# Patient Record
Sex: Female | Born: 1980 | Race: White | Hispanic: No | Marital: Married | Smoking: Never smoker
Health system: Southern US, Community
[De-identification: ages and names within clinical notes are randomized; demographics above are authoritative.]

## PROBLEM LIST (undated history)

## (undated) ENCOUNTER — Inpatient Hospital Stay (HOSPITAL_COMMUNITY): Payer: Self-pay

## (undated) DIAGNOSIS — E039 Hypothyroidism, unspecified: Secondary | ICD-10-CM

## (undated) DIAGNOSIS — E282 Polycystic ovarian syndrome: Secondary | ICD-10-CM

## (undated) HISTORY — PX: TONSILLECTOMY: SUR1361

## (undated) HISTORY — PX: LEG SURGERY: SHX1003

## (undated) HISTORY — PX: WISDOM TOOTH EXTRACTION: SHX21

---

## 2014-07-09 ENCOUNTER — Other Ambulatory Visit (HOSPITAL_COMMUNITY): Payer: Self-pay | Admitting: Obstetrics and Gynecology

## 2014-07-09 DIAGNOSIS — Z3141 Encounter for fertility testing: Secondary | ICD-10-CM

## 2014-07-16 ENCOUNTER — Ambulatory Visit (HOSPITAL_COMMUNITY)
Admission: RE | Admit: 2014-07-16 | Discharge: 2014-07-16 | Disposition: A | Discharge status: Home / Self Care | Payer: BC Managed Care – PPO | Source: Ambulatory Visit | Attending: Obstetrics and Gynecology | Admitting: Obstetrics and Gynecology

## 2014-07-16 DIAGNOSIS — Z3141 Encounter for fertility testing: Secondary | ICD-10-CM

## 2014-07-16 DIAGNOSIS — N979 Female infertility, unspecified: Secondary | ICD-10-CM | POA: Diagnosis present

## 2014-07-16 MED ORDER — IOHEXOL 300 MG/ML  SOLN
20.0000 mL | Freq: Once | INTRAMUSCULAR | Status: AC | PRN
  Administered 2014-07-16: 20 mL

## 2014-10-27 ENCOUNTER — Inpatient Hospital Stay (HOSPITAL_COMMUNITY)
Admission: AD | Admit: 2014-10-27 | Discharge: 2014-10-27 | Disposition: A | Discharge status: Home / Self Care | Payer: BC Managed Care – PPO | Source: Ambulatory Visit | Attending: Obstetrics and Gynecology | Admitting: Obstetrics and Gynecology

## 2014-10-27 ENCOUNTER — Encounter (HOSPITAL_COMMUNITY): Payer: Self-pay | Admitting: *Deleted

## 2014-10-27 ENCOUNTER — Inpatient Hospital Stay (HOSPITAL_COMMUNITY): Payer: BC Managed Care – PPO

## 2014-10-27 DIAGNOSIS — O209 Hemorrhage in early pregnancy, unspecified: Secondary | ICD-10-CM | POA: Diagnosis not present

## 2014-10-27 DIAGNOSIS — Z3A01 Less than 8 weeks gestation of pregnancy: Secondary | ICD-10-CM | POA: Insufficient documentation

## 2014-10-27 HISTORY — DX: Hypothyroidism, unspecified: E03.9

## 2014-10-27 HISTORY — DX: Polycystic ovarian syndrome: E28.2

## 2014-10-27 LAB — CBC WITH DIFFERENTIAL/PLATELET
Basophils Absolute: 0 10*3/uL (ref 0.0–0.1)
Basophils Relative: 0 % (ref 0–1)
EOS ABS: 0.2 10*3/uL (ref 0.0–0.7)
Eosinophils Relative: 2 % (ref 0–5)
HEMATOCRIT: 39.6 % (ref 36.0–46.0)
HEMOGLOBIN: 13.4 g/dL (ref 12.0–15.0)
LYMPHS ABS: 2.7 10*3/uL (ref 0.7–4.0)
Lymphocytes Relative: 24 % (ref 12–46)
MCH: 29 pg (ref 26.0–34.0)
MCHC: 33.8 g/dL (ref 30.0–36.0)
MCV: 85.7 fL (ref 78.0–100.0)
MONO ABS: 0.8 10*3/uL (ref 0.1–1.0)
MONOS PCT: 7 % (ref 3–12)
NEUTROS PCT: 67 % (ref 43–77)
Neutro Abs: 7.4 10*3/uL (ref 1.7–7.7)
Platelets: 334 10*3/uL (ref 150–400)
RBC: 4.62 MIL/uL (ref 3.87–5.11)
RDW: 14.1 % (ref 11.5–15.5)
WBC: 11.1 10*3/uL — ABNORMAL HIGH (ref 4.0–10.5)

## 2014-10-27 LAB — URINALYSIS, ROUTINE W REFLEX MICROSCOPIC
Bilirubin Urine: NEGATIVE
Glucose, UA: NEGATIVE mg/dL
Ketones, ur: NEGATIVE mg/dL
LEUKOCYTES UA: NEGATIVE
NITRITE: NEGATIVE
Protein, ur: NEGATIVE mg/dL
Urobilinogen, UA: 0.2 mg/dL (ref 0.0–1.0)
pH: 5.5 (ref 5.0–8.0)

## 2014-10-27 LAB — POCT PREGNANCY, URINE: PREG TEST UR: POSITIVE — AB

## 2014-10-27 LAB — HCG, QUANTITATIVE, PREGNANCY: hCG, Beta Chain, Quant, S: 526 m[IU]/mL — ABNORMAL HIGH (ref <5)

## 2014-10-27 LAB — URINE MICROSCOPIC-ADD ON: WBC UA: NONE SEEN WBC/hpf (ref <3)

## 2014-10-27 LAB — WET PREP, GENITAL
CLUE CELLS WET PREP: NONE SEEN
Trich, Wet Prep: NONE SEEN
YEAST WET PREP: NONE SEEN

## 2014-10-27 LAB — ABO/RH: ABO/RH(D): A POS

## 2014-10-27 NOTE — Discharge Instructions (Signed)
Pelvic Rest °Pelvic rest is sometimes recommended for women when:  °· The placenta is partially or completely covering the opening of the cervix (placenta previa). °· There is bleeding between the uterine wall and the amniotic sac in the first trimester (subchorionic hemorrhage). °· The cervix begins to open without labor starting (incompetent cervix, cervical insufficiency). °· The labor is too early (preterm labor). °HOME CARE INSTRUCTIONS °· Do not have sexual intercourse, stimulation, or an orgasm. °· Do not use tampons, douche, or put anything in the vagina. °· Do not lift anything over 10 pounds (4.5 kg). °· Avoid strenuous activity or straining your pelvic muscles. °SEEK MEDICAL CARE IF:  °· You have any vaginal bleeding during pregnancy. Treat this as a potential emergency. °· You have cramping pain felt low in the stomach (stronger than menstrual cramps). °· You notice vaginal discharge (watery, mucus, or bloody). °· You have a low, dull backache. °· There are regular contractions or uterine tightening. °SEEK IMMEDIATE MEDICAL CARE IF: °You have vaginal bleeding and have placenta previa.  °Document Released: 08/29/2010 Document Revised: 07/27/2011 Document Reviewed: 08/29/2010 °ExitCare® Patient Information ©2015 [employer]. This information is not intended to replace advice given to you by your health care provider. Make sure you discuss any questions you have with your health care provider. ° °Threatened Miscarriage °A threatened miscarriage is when you have vaginal bleeding during your first 20 weeks of pregnancy but the pregnancy has not ended. Your doctor will do tests to make sure you are still pregnant. The cause of the bleeding may not be known. This condition does not mean your pregnancy will end. It does increase the risk of it ending (complete miscarriage). °HOME CARE  °· Make sure you keep all your doctor visits for prenatal care. °· Get plenty of rest. °· Do not have sex or use tampons if  you have vaginal bleeding. °· Do not douche. °· Do not smoke or use drugs. °· Do not drink alcohol. °· Avoid caffeine. °GET HELP IF: °· You have light bleeding from your vagina. °· You have belly pain or cramping. °· You have a fever. °GET HELP RIGHT AWAY IF:  °· You have heavy bleeding from your vagina. °· You have clots of blood coming from your vagina. °· You have bad pain or cramps in your low back or belly. °· You have fever, chills, and bad belly pain. °MAKE SURE YOU:  °· Understand these instructions. °· Will watch your condition. °· Will get help right away if you are not doing well or get worse. °Document Released: 04/16/2008 Document Revised: 05/09/2013 Document Reviewed: 02/28/2013 °ExitCare® Patient Information ©2015 [employer]. This information is not intended to replace advice given to you by your health care provider. Make sure you discuss any questions you have with your health care provider. ° °

## 2014-10-27 NOTE — MAU Note (Signed)
Pt presents to MAU with complaints of lower abdominal cramping with brown  vaginal bleeding that started yesterday and today is bright red bleeding.

## 2014-10-27 NOTE — MAU Provider Note (Signed)
History     CSN: [id number]  Arrival date and time: 10/27/14 4401   First Provider Initiated Contact with Patient 10/27/14 [phone]      Chief Complaint  Patient presents with  . Vaginal Bleeding  . Abdominal Cramping   HPI  Ms. Molly Jacobs is a 34 y.o. G1P0000 at [redacted]w[redacted]d who presents to MAU today with complaint of vaginal bleeding. The patient states brown spotting x 1 week that became red and heavier last night. She states bleeding is still less than a period, but more than spotting. She states mild lower abdominal and low back discomfort today. She has not taken anything for pain. She denies fever, UTI symptoms, N/V/D.   OB History    Gravida Para Term Preterm AB TAB SAB Ectopic Multiple Living   1 0 0 0 0 0 0 0 0 0       Past Medical History  Diagnosis Date  . PCOS (polycystic ovarian syndrome)   . Hypothyroidism     Past Surgical History  Procedure Laterality Date  . Tonsillectomy    . Leg surgery      History reviewed. No pertinent family history.  History  Substance Use Topics  . Smoking status: Never Smoker   . Smokeless tobacco: Never Used  . Alcohol Use: No    Allergies: No Known Allergies  No prescriptions prior to admission    Review of Systems  Constitutional: Negative for fever and malaise/fatigue.  Gastrointestinal: Positive for abdominal pain. Negative for nausea, vomiting, diarrhea and constipation.  Genitourinary: Negative for dysuria, urgency and frequency.       + vaginal bleeding  Musculoskeletal: Positive for back pain.   Physical Exam   Blood pressure 126/77, pulse 95, temperature 98.2 F (36.8 C), temperature source Oral, resp. rate 18, height 5' (1.524 m), weight 187 lb (84.823 kg), last menstrual period 09/09/2014.  Physical Exam  Nursing note and vitals reviewed. Constitutional: She is oriented to person, place, and time. She appears well-developed and well-nourished. No distress.  HENT:  Head: Normocephalic and atraumatic.   Cardiovascular: Normal rate.   Respiratory: Effort normal.  GI: Soft. She exhibits no distension and no mass. There is no tenderness. There is no rebound and no guarding.  Genitourinary: Uterus is not enlarged and not tender. Cervix exhibits no motion tenderness, no discharge and no friability. Right adnexum displays no mass and no tenderness. Left adnexum displays no mass and no tenderness. There is bleeding (small amount of blood in the vaginal vault) in the vagina. No vaginal discharge found.  Cervix: closed, thick  Neurological: She is alert and oriented to person, place, and time.  Skin: Skin is warm and dry. No erythema.  Psychiatric: She has a normal mood and affect.   Results for orders placed or performed during the hospital encounter of 10/27/14 (from the past 24 hour(s))  Urinalysis, Routine w reflex microscopic (not at [facility])     Status: Abnormal   Collection Time: 10/27/14  8:30 AM  Result Value Ref Range   Color, Urine YELLOW YELLOW   APPearance CLEAR CLEAR   Specific Gravity, Urine <1.005 (L) 1.005 - 1.030   pH 5.5 5.0 - 8.0   Glucose, UA NEGATIVE NEGATIVE mg/dL   Hgb urine dipstick LARGE (A) NEGATIVE   Bilirubin Urine NEGATIVE NEGATIVE   Ketones, ur NEGATIVE NEGATIVE mg/dL   Protein, ur NEGATIVE NEGATIVE mg/dL   Urobilinogen, UA 0.2 0.0 - 1.0 mg/dL   Nitrite NEGATIVE NEGATIVE   Leukocytes, UA NEGATIVE NEGATIVE  Urine microscopic-add on     Status: None   Collection Time: 10/27/14  8:30 AM  Result Value Ref Range   WBC, UA  <3 WBC/hpf    NO FORMED ELEMENTS SEEN ON URINE MICROSCOPIC EXAMINATION   RBC / HPF 3-6 <3 RBC/hpf  Pregnancy, urine POC     Status: Abnormal   Collection Time: 10/27/14  8:41 AM  Result Value Ref Range   Preg Test, Ur POSITIVE (A) NEGATIVE  Wet prep, genital     Status: Abnormal   Collection Time: 10/27/14  8:50 AM  Result Value Ref Range   Yeast Wet Prep HPF POC NONE SEEN NONE SEEN   Trich, Wet Prep NONE SEEN NONE SEEN   Clue Cells Wet Prep  HPF POC NONE SEEN NONE SEEN   WBC, Wet Prep HPF POC FEW (A) NONE SEEN  CBC with Differential/Platelet     Status: Abnormal   Collection Time: 10/27/14  8:58 AM  Result Value Ref Range   WBC 11.1 (H) 4.0 - 10.5 K/uL   RBC 4.62 3.87 - 5.11 MIL/uL   Hemoglobin 13.4 12.0 - 15.0 g/dL   HCT 16.1 09.6 - 04.5 %   MCV 85.7 78.0 - 100.0 fL   MCH 29.0 26.0 - 34.0 pg   MCHC 33.8 30.0 - 36.0 g/dL   RDW 40.9 81.1 - 91.4 %   Platelets 334 150 - 400 K/uL   Neutrophils Relative % 67 43 - 77 %   Neutro Abs 7.4 1.7 - 7.7 K/uL   Lymphocytes Relative 24 12 - 46 %   Lymphs Abs 2.7 0.7 - 4.0 K/uL   Monocytes Relative 7 3 - 12 %   Monocytes Absolute 0.8 0.1 - 1.0 K/uL   Eosinophils Relative 2 0 - 5 %   Eosinophils Absolute 0.2 0.0 - 0.7 K/uL   Basophils Relative 0 0 - 1 %   Basophils Absolute 0.0 0.0 - 0.1 K/uL  ABO/Rh     Status: None (Preliminary result)   Collection Time: 10/27/14  8:58 AM  Result Value Ref Range   ABO/RH(D) A POS   hCG, quantitative, pregnancy     Status: Abnormal   Collection Time: 10/27/14  8:58 AM  Result Value Ref Range   hCG, Beta Chain, Quant, S 526 (H) <5 mIU/mL   US Ob Comp Less 14 Wks  10/27/2014   CLINICAL DATA:  Bleeding and cramping since 10/26/2014.  EXAM: OBSTETRIC <14 WK [place] AND TRANSVAGINAL OB US  TECHNIQUE: Both transabdominal and transvaginal ultrasound examinations were performed for complete evaluation of the gestation as well as the maternal uterus, adnexal regions, and pelvic cul-de-sac. Transvaginal technique was performed to assess early pregnancy.  COMPARISON:  None.  FINDINGS: Technically difficult examination due to patient body habitus.  Intrauterine gestational sac: Round hypoechoic/cystic focus in the fundal endometrium on the left may represent a small gestational sac.  Yolk sac:  Not visualized.  Embryo:  Not visualized.  MSD: 4.1  mm   4 w   6  d  Maternal uterus/adnexae: Right ovary was unremarkable in appearance. A 2.5 cm structure was present in the  left ovary which was predominantly hyperechoic peripherally but with some central areas that were more hypoechoic in no significant internal blood flow by color Doppler. No free fluid. No adnexal mass.  IMPRESSION: 1. Possible early intrauterine gestational sac as above, but no yolk sac, fetal pole, or cardiac activity yet visualized. Recommend follow-up quantitative B-HCG levels and follow-up US in 14  days to confirm and assess viability. This recommendation follows SRU consensus guidelines: Diagnostic Criteria for Nonviable Pregnancy Early in the First Trimester. Malva Limes Med 2013; 161:0960-45. 2. No ectopic pregnancy identified. 3. 2.5 cm mixed echogenicity structure in the left ovary, possibly a corpus luteum. Attention on follow-up.   Electronically Signed   By: Sebastian Ache   On: 10/27/2014 10:03   US Ob Transvaginal  10/27/2014   CLINICAL DATA:  Bleeding and cramping since 10/26/2014.  EXAM: OBSTETRIC <14 WK [place] AND TRANSVAGINAL OB US  TECHNIQUE: Both transabdominal and transvaginal ultrasound examinations were performed for complete evaluation of the gestation as well as the maternal uterus, adnexal regions, and pelvic cul-de-sac. Transvaginal technique was performed to assess early pregnancy.  COMPARISON:  None.  FINDINGS: Technically difficult examination due to patient body habitus.  Intrauterine gestational sac: Round hypoechoic/cystic focus in the fundal endometrium on the left may represent a small gestational sac.  Yolk sac:  Not visualized.  Embryo:  Not visualized.  MSD: 4.1  mm   4 w   6  d  Maternal uterus/adnexae: Right ovary was unremarkable in appearance. A 2.5 cm structure was present in the left ovary which was predominantly hyperechoic peripherally but with some central areas that were more hypoechoic in no significant internal blood flow by color Doppler. No free fluid. No adnexal mass.  IMPRESSION: 1. Possible early intrauterine gestational sac as above, but no yolk sac, fetal pole, or  cardiac activity yet visualized. Recommend follow-up quantitative B-HCG levels and follow-up US in 14 days to confirm and assess viability. This recommendation follows SRU consensus guidelines: Diagnostic Criteria for Nonviable Pregnancy Early in the First Trimester. Malva Limes Med 2013; 409:8119-14. 2. No ectopic pregnancy identified. 3. 2.5 cm mixed echogenicity structure in the left ovary, possibly a corpus luteum. Attention on follow-up.   Electronically Signed   By: Sebastian Ache   On: 10/27/2014 10:03    MAU Course  Procedures None  MDM +UPT UA, wet prep, GC/chlamydia, CBC, ABO/Rh, quant hCG, HIV, RPR and [place] today to rule out ectopic pregnancy Discussed with Dr. Arelia Sneddon. Agrees with plan for discharge at this time. Patient should follow-up in the office on Monday for repeat labs Assessment and Plan  A: Vaginal bleeding in pregnancy Pregnancy of unknown location  P: Discharge home Bleeding/ectopic precautions discussed Patient advised to follow-up with [facility] on Monday for repeat labs  Patient may return to MAU as needed or if her condition were to change or worsen   Marny Lowenstein, PA-C  10/27/2014, 10:30 AM

## 2014-10-28 LAB — HIV ANTIBODY (ROUTINE TESTING W REFLEX): HIV Screen 4th Generation wRfx: NONREACTIVE

## 2014-10-28 LAB — RPR: RPR Ser Ql: NONREACTIVE

## 2014-10-29 LAB — GC/CHLAMYDIA PROBE AMP (~~LOC~~) NOT AT ARMC
CHLAMYDIA, DNA PROBE: NEGATIVE
NEISSERIA GONORRHEA: NEGATIVE

## 2015-05-19 NOTE — L&D Delivery Note (Signed)
Delivery Note  SVD viable female Apgars 9,10 over 2nd degree ML lac.  Placenta delivered spontaneously intact with 3VC. Repair with 2-0 Chromic with good support and hemostasis noted and R/V exam confirms.  PH art was 7.28.  [place] cord blood was sent.  Mother and baby were doing well.  EBL 300cc  Candice Campavid Carlen Rebuck, MD

## 2015-06-12 ENCOUNTER — Other Ambulatory Visit: Payer: Self-pay | Admitting: Obstetrics and Gynecology

## 2015-06-12 LAB — OB RESULTS CONSOLE ABO/RH: RH Type: POSITIVE

## 2015-06-12 LAB — OB RESULTS CONSOLE GC/CHLAMYDIA
Chlamydia: NEGATIVE
Gonorrhea: NEGATIVE

## 2015-06-12 LAB — OB RESULTS CONSOLE HIV ANTIBODY (ROUTINE TESTING): HIV: NONREACTIVE

## 2015-06-12 LAB — OB RESULTS CONSOLE RPR: RPR: NONREACTIVE

## 2015-06-12 LAB — OB RESULTS CONSOLE HEPATITIS B SURFACE ANTIGEN: HEP B S AG: NEGATIVE

## 2015-06-12 LAB — OB RESULTS CONSOLE RUBELLA ANTIBODY, IGM: RUBELLA: IMMUNE

## 2015-06-12 LAB — OB RESULTS CONSOLE ANTIBODY SCREEN: ANTIBODY SCREEN: NEGATIVE

## 2015-09-01 ENCOUNTER — Encounter (HOSPITAL_COMMUNITY): Payer: Self-pay | Admitting: *Deleted

## 2015-09-23 ENCOUNTER — Ambulatory Visit (HOSPITAL_COMMUNITY)
Admission: RE | Admit: 2015-09-23 | Discharge: 2015-09-23 | Disposition: A | Discharge status: Home / Self Care | Payer: BC Managed Care – PPO | Source: Ambulatory Visit | Attending: Obstetrics and Gynecology | Admitting: Obstetrics and Gynecology

## 2015-09-23 ENCOUNTER — Other Ambulatory Visit (HOSPITAL_COMMUNITY): Payer: Self-pay | Admitting: Obstetrics and Gynecology

## 2015-09-23 DIAGNOSIS — Z3689 Encounter for other specified antenatal screening: Secondary | ICD-10-CM

## 2015-09-23 DIAGNOSIS — Z3A23 23 weeks gestation of pregnancy: Secondary | ICD-10-CM | POA: Insufficient documentation

## 2015-09-23 DIAGNOSIS — Z36 Encounter for antenatal screening of mother: Secondary | ICD-10-CM | POA: Diagnosis not present

## 2015-09-23 DIAGNOSIS — E039 Hypothyroidism, unspecified: Secondary | ICD-10-CM | POA: Insufficient documentation

## 2015-09-23 DIAGNOSIS — O99282 Endocrine, nutritional and metabolic diseases complicating pregnancy, second trimester: Secondary | ICD-10-CM | POA: Diagnosis present

## 2015-11-06 ENCOUNTER — Encounter: Payer: BC Managed Care – PPO | Attending: Obstetrics and Gynecology | Admitting: Skilled Nursing Facility1

## 2015-11-06 VITALS — Ht 60.0 in | Wt 214.0 lb

## 2015-11-06 DIAGNOSIS — R7309 Other abnormal glucose: Secondary | ICD-10-CM | POA: Insufficient documentation

## 2015-11-06 DIAGNOSIS — O24410 Gestational diabetes mellitus in pregnancy, diet controlled: Secondary | ICD-10-CM

## 2015-11-07 ENCOUNTER — Encounter: Payer: Self-pay | Admitting: Skilled Nursing Facility1

## 2015-11-07 NOTE — Progress Notes (Signed)
  Patient was seen on 11/06/2015 for Gestational Diabetes self-management class at the [facility]. The following learning objectives were met by the patient during this course:   States the definition of Gestational Diabetes  States why dietary management is important in controlling blood glucose  Describes the effects each nutrient has on blood glucose levels  Demonstrates ability to create a balanced meal plan  Demonstrates carbohydrate counting   States when to check blood glucose levels involving a total of 4 separate occurences in a day  Demonstrates proper blood glucose monitoring techniques  States the effect of stress and exercise on blood glucose levels  States the importance of limiting caffeine and abstaining from alcohol and smoking  Demonstrates the knowledge the glucometer provided in class may not be covered by their insurance and to call their insurance provider immediately after class to know which glucometer their insurance provider does cover as well as calling their physician the next day for a prescription to the glucometer their insurance does cover (if the one provided is not) as well as the lancets and strips for that meter.  Blood glucose monitor given: one touch verio flex Lot # [id number] x Exp: 12/15/2016 Blood glucose reading: 99  Patient instructed to monitor glucose levels: FBS: 60 - <90 1 hour: <140 2 hour: <120  *Patient received handouts:  Nutrition Diabetes and Pregnancy  Carbohydrate Counting List  Patient will be seen for follow-up as needed.

## 2015-11-17 IMAGING — US US OB COMP LESS 14 WK
1 series · 13 of 28 positions shown · non-contrast
Comparison: None.

CLINICAL DATA: Bleeding and cramping since 10/26/2014.

EXAM:
OBSTETRIC <14 WK US AND TRANSVAGINAL OB US
TECHNIQUE: Both transabdominal and transvaginal ultrasound examinations were
performed for complete evaluation of the gestation as well as the
maternal uterus, adnexal regions, and pelvic cul-de-sac.
Transvaginal technique was performed to assess early pregnancy.

[Series 1: us ob comp less 14 wk · 13 of 57 slices shown]
[im 3/57]
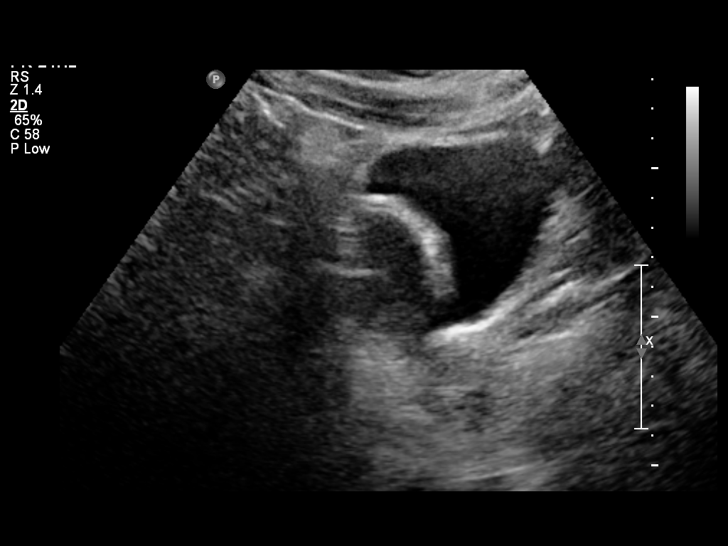
[im 7/57]
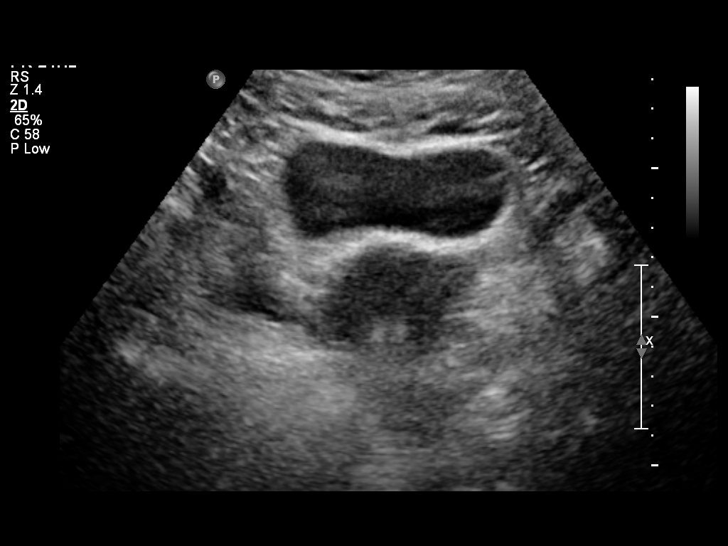
[im 11/57]
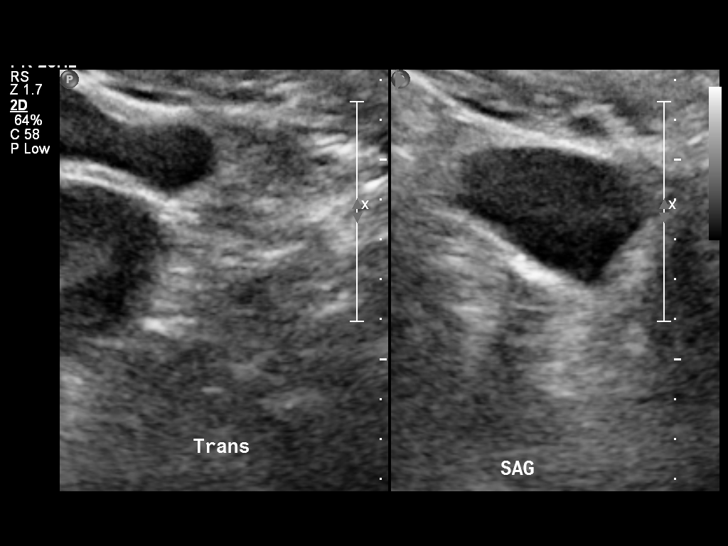
[im 15/57]
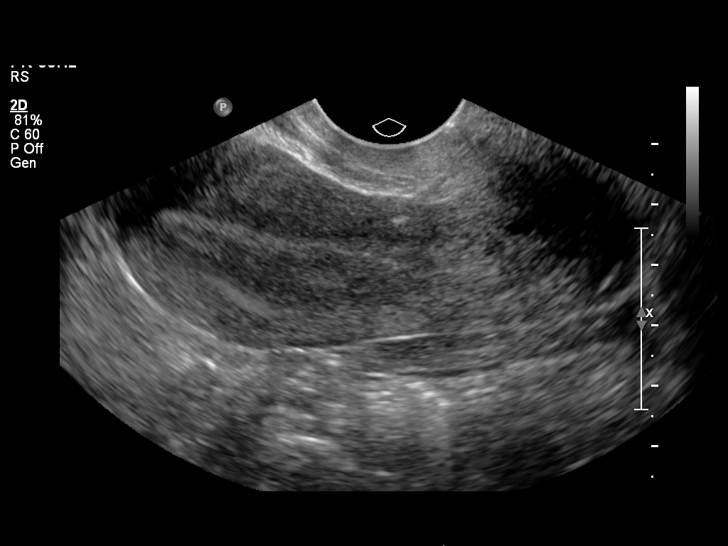
[im 19/57]
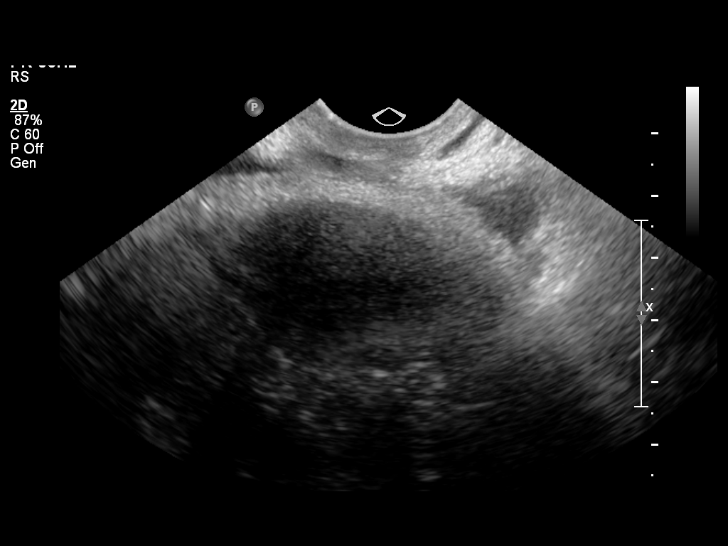
[im 23/57]
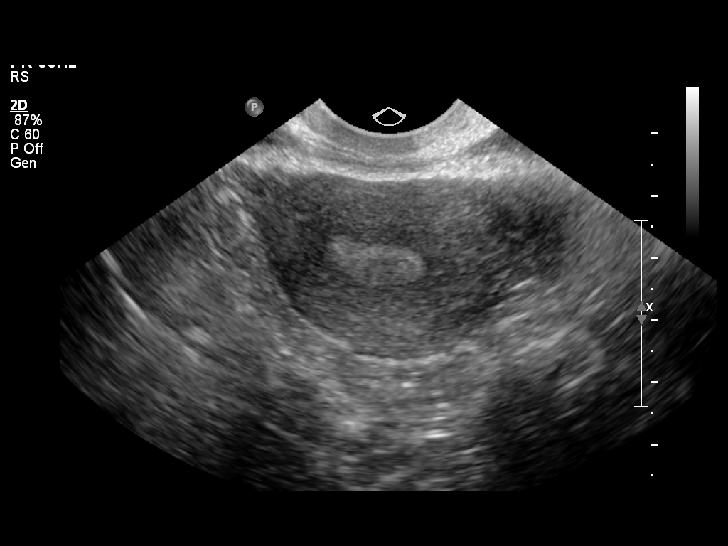
[im 30/57]
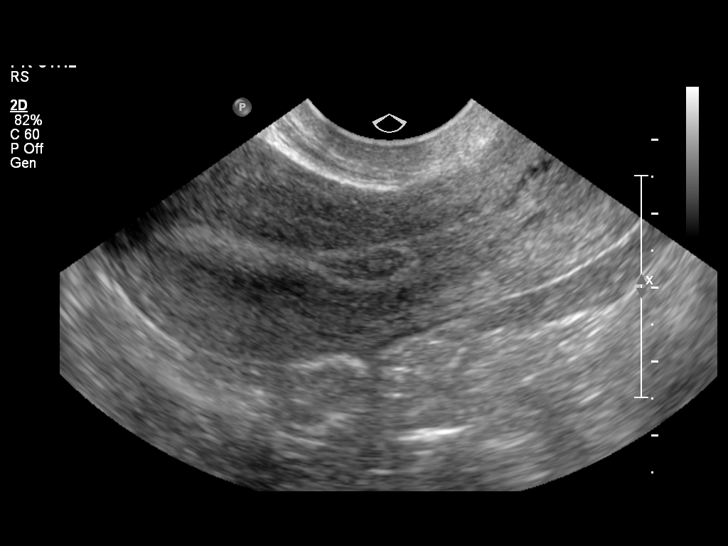
[im 34/57]
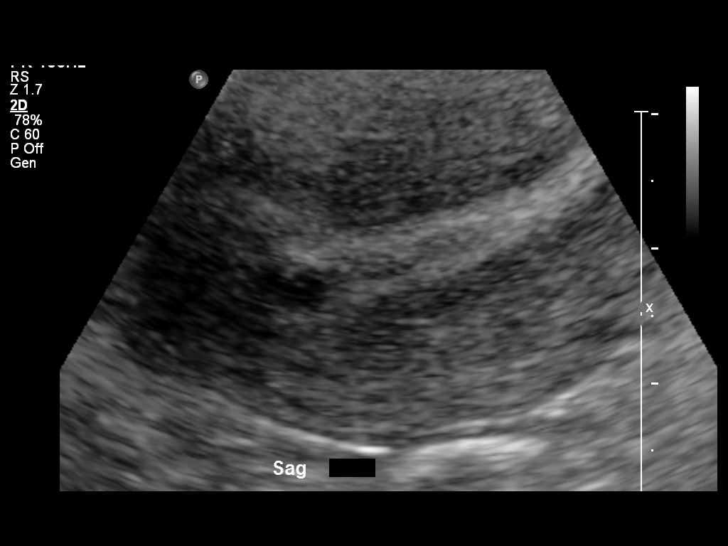
[im 38/57]
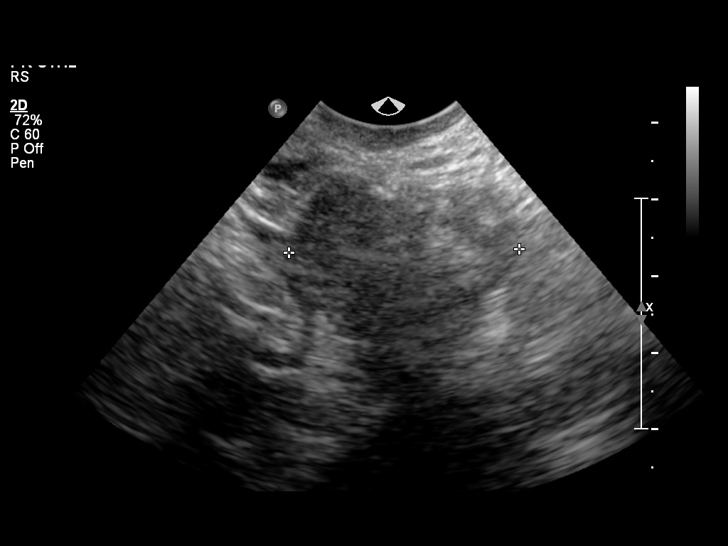
[im 42/57]
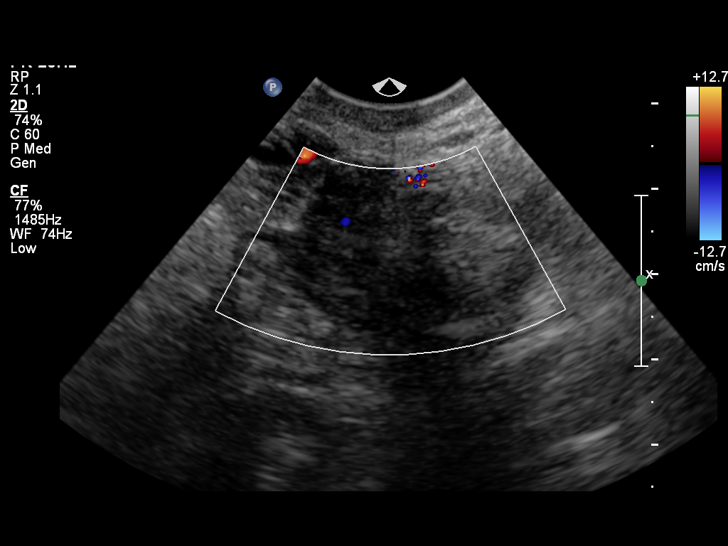
[im 46/57]
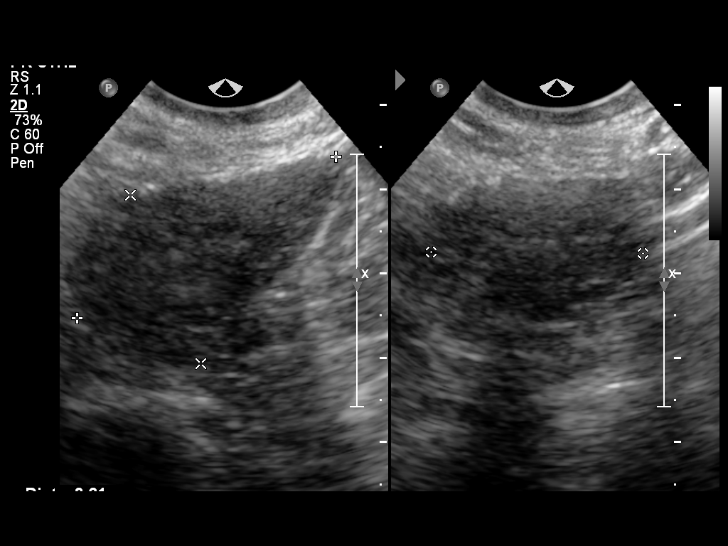
[im 50/57]
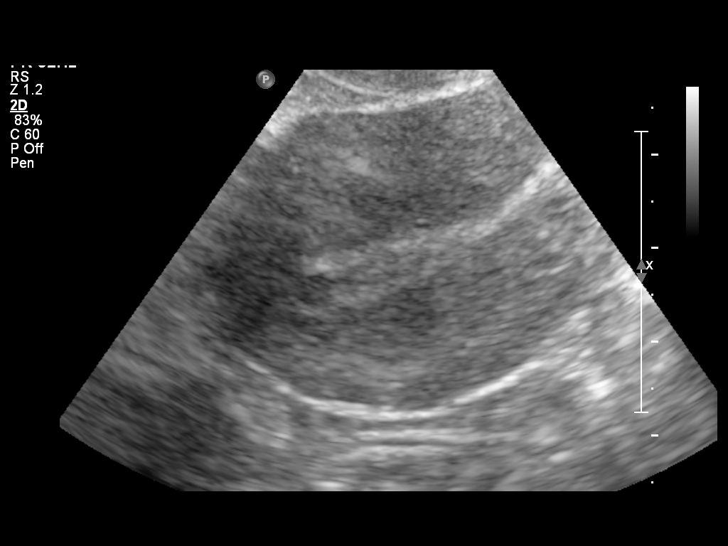
[im 54/57]
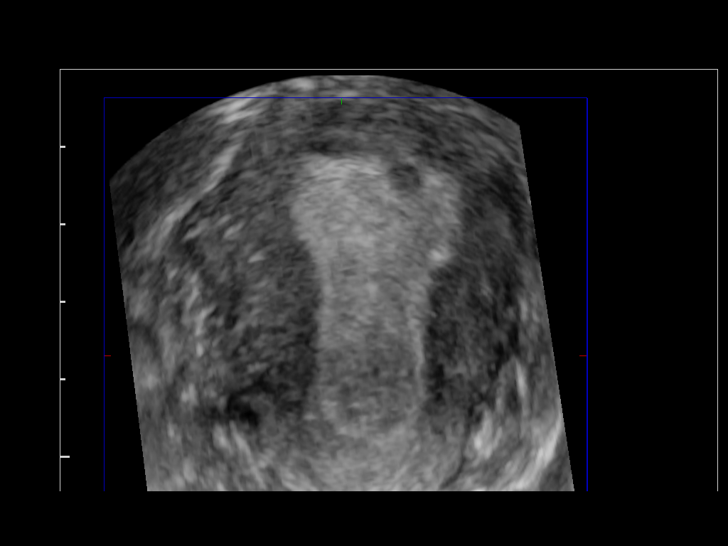

[13 of 28 positions shown; findings below may reference images not displayed]

FINDINGS: Technically difficult examination due to patient body habitus.

Intrauterine gestational sac: Round hypoechoic/cystic focus in the
fundal endometrium on the left may represent a small gestational
sac.

Yolk sac:  Not visualized.

Embryo:  Not visualized.

MSD: 4.1  mm   4 w   6  d

Maternal uterus/adnexae: Right ovary was unremarkable in appearance.
A 2.5 cm structure was present in the left ovary which was
predominantly hyperechoic peripherally but with some central areas
that were more hypoechoic in no significant internal blood flow by
color Doppler. No free fluid. No adnexal mass.
IMPRESSION: 1. Possible early intrauterine gestational sac as above, but no yolk
sac, fetal pole, or cardiac activity yet visualized. Recommend
follow-up quantitative B-HCG levels and follow-up US in 14 days to
confirm and assess viability. This recommendation follows SRU
consensus guidelines: Diagnostic Criteria for Nonviable Pregnancy
Early in the First Trimester. N Engl J Med 7524; [DATE].
2. No ectopic pregnancy identified.
3. 2.5 cm mixed echogenicity structure in the left ovary, possibly a
corpus luteum. Attention on follow-up.

## 2015-12-17 LAB — OB RESULTS CONSOLE GBS: GBS: POSITIVE

## 2016-01-14 ENCOUNTER — Telehealth (HOSPITAL_COMMUNITY): Payer: Self-pay | Admitting: *Deleted

## 2016-01-14 ENCOUNTER — Encounter (HOSPITAL_COMMUNITY): Payer: Self-pay | Admitting: *Deleted

## 2016-01-14 NOTE — Telephone Encounter (Signed)
Preadmission screen  

## 2016-01-21 ENCOUNTER — Inpatient Hospital Stay (HOSPITAL_COMMUNITY): Payer: BC Managed Care – PPO | Admitting: Anesthesiology

## 2016-01-21 ENCOUNTER — Encounter (HOSPITAL_COMMUNITY): Payer: Self-pay | Admitting: *Deleted

## 2016-01-21 ENCOUNTER — Inpatient Hospital Stay (HOSPITAL_COMMUNITY)
Admission: AD | Admit: 2016-01-21 | Discharge: 2016-01-23 | DRG: 775 | Disposition: A | Discharge status: Home / Self Care | Payer: BC Managed Care – PPO | Source: Ambulatory Visit | Attending: Obstetrics and Gynecology | Admitting: Obstetrics and Gynecology

## 2016-01-21 ENCOUNTER — Inpatient Hospital Stay (HOSPITAL_COMMUNITY): Admission: RE | Admit: 2016-01-21 | Payer: BC Managed Care – PPO | Source: Ambulatory Visit

## 2016-01-21 DIAGNOSIS — Z833 Family history of diabetes mellitus: Secondary | ICD-10-CM

## 2016-01-21 DIAGNOSIS — Z3A40 40 weeks gestation of pregnancy: Secondary | ICD-10-CM | POA: Diagnosis not present

## 2016-01-21 DIAGNOSIS — O99824 Streptococcus B carrier state complicating childbirth: Secondary | ICD-10-CM | POA: Diagnosis present

## 2016-01-21 DIAGNOSIS — O2243 Hemorrhoids in pregnancy, third trimester: Secondary | ICD-10-CM | POA: Diagnosis present

## 2016-01-21 DIAGNOSIS — O24420 Gestational diabetes mellitus in childbirth, diet controlled: Secondary | ICD-10-CM | POA: Diagnosis present

## 2016-01-21 DIAGNOSIS — Z823 Family history of stroke: Secondary | ICD-10-CM | POA: Diagnosis not present

## 2016-01-21 DIAGNOSIS — O99214 Obesity complicating childbirth: Secondary | ICD-10-CM | POA: Diagnosis present

## 2016-01-21 DIAGNOSIS — Z6841 Body Mass Index (BMI) 40.0 and over, adult: Secondary | ICD-10-CM

## 2016-01-21 DIAGNOSIS — Z8249 Family history of ischemic heart disease and other diseases of the circulatory system: Secondary | ICD-10-CM | POA: Diagnosis not present

## 2016-01-21 DIAGNOSIS — O480 Post-term pregnancy: Principal | ICD-10-CM | POA: Diagnosis present

## 2016-01-21 LAB — CBC
HCT: 38.6 % (ref 36.0–46.0)
HEMOGLOBIN: 13 g/dL (ref 12.0–15.0)
MCH: 28 pg (ref 26.0–34.0)
MCHC: 33.7 g/dL (ref 30.0–36.0)
MCV: 83 fL (ref 78.0–100.0)
Platelets: 229 10*3/uL (ref 150–400)
RBC: 4.65 MIL/uL (ref 3.87–5.11)
RDW: 15.6 % — ABNORMAL HIGH (ref 11.5–15.5)
WBC: 13.7 10*3/uL — ABNORMAL HIGH (ref 4.0–10.5)

## 2016-01-21 LAB — SYPHILIS: RPR W/REFLEX TO RPR TITER AND TREPONEMAL ANTIBODIES, TRADITIONAL SCREENING AND DIAGNOSIS ALGORITHM: RPR Ser Ql: NONREACTIVE

## 2016-01-21 LAB — GLUCOSE, CAPILLARY
GLUCOSE-CAPILLARY: 86 mg/dL (ref 65–99)
GLUCOSE-CAPILLARY: 90 mg/dL (ref 65–99)
Glucose-Capillary: 106 mg/dL — ABNORMAL HIGH (ref 65–99)
Glucose-Capillary: 79 mg/dL (ref 65–99)
Glucose-Capillary: 87 mg/dL (ref 65–99)
Glucose-Capillary: 115 mg/dL — ABNORMAL HIGH (ref 65–99)
Glucose-Capillary: 193 mg/dL — ABNORMAL HIGH (ref 65–99)

## 2016-01-21 LAB — TYPE AND SCREEN
ABO/RH(D): A POS
Antibody Screen: NEGATIVE

## 2016-01-21 MED ORDER — MISOPROSTOL 25 MCG QUARTER TABLET
25.0000 ug | ORAL_TABLET | ORAL | Status: DC | PRN
Start: 2016-01-21 — End: 2016-01-21
  Administered 2016-01-21 (×2): 25 ug via VAGINAL
  Filled 2016-01-21: qty 0.25
  Filled 2016-01-21: qty 1
  Filled 2016-01-21: qty 0.25

## 2016-01-21 MED ORDER — FENTANYL 2.5 MCG/ML BUPIVACAINE 1/10 % EPIDURAL INFUSION (WH - ANES)
14.0000 mL/h | INTRAMUSCULAR | Status: DC | PRN
  Administered 2016-01-21 (×2): 14 mL/h via EPIDURAL
  Filled 2016-01-21 (×2): qty 125

## 2016-01-21 MED ORDER — OXYCODONE-ACETAMINOPHEN 5-325 MG PO TABS: 2.0000 | ORAL_TABLET | ORAL | Status: DC | PRN

## 2016-01-21 MED ORDER — SENNOSIDES-DOCUSATE SODIUM 8.6-50 MG PO TABS
2.0000 | ORAL_TABLET | ORAL | Status: DC
  Administered 2016-01-22 – 2016-01-23 (×2): 2 via ORAL
  Filled 2016-01-21 (×2): qty 2

## 2016-01-21 MED ORDER — FLEET ENEMA 7-19 GM/118ML RE ENEM: 1.0000 | ENEMA | RECTAL | Status: DC | PRN

## 2016-01-21 MED ORDER — IBUPROFEN 600 MG PO TABS
600.0000 mg | ORAL_TABLET | Freq: Four times a day (QID) | ORAL | Status: DC
  Administered 2016-01-22 – 2016-01-23 (×7): 600 mg via ORAL
  Filled 2016-01-21 (×7): qty 1

## 2016-01-21 MED ORDER — PRENATAL MULTIVITAMIN CH
1.0000 | ORAL_TABLET | Freq: Every day | ORAL | Status: DC
  Administered 2016-01-22 – 2016-01-23 (×2): 1 via ORAL
  Filled 2016-01-21 (×2): qty 1

## 2016-01-21 MED ORDER — TERBUTALINE SULFATE 1 MG/ML IJ SOLN
0.2500 mg | Freq: Once | INTRAMUSCULAR | Status: DC | PRN
  Filled 2016-01-21: qty 1

## 2016-01-21 MED ORDER — SIMETHICONE 80 MG PO CHEW: 80.0000 mg | CHEWABLE_TABLET | ORAL | Status: DC | PRN

## 2016-01-21 MED ORDER — WITCH HAZEL-GLYCERIN EX PADS
1.0000 "application " | MEDICATED_PAD | CUTANEOUS | Status: DC | PRN
  Administered 2016-01-22: 1 via TOPICAL

## 2016-01-21 MED ORDER — DIPHENHYDRAMINE HCL 25 MG PO CAPS: 25.0000 mg | ORAL_CAPSULE | Freq: Four times a day (QID) | ORAL | Status: DC | PRN

## 2016-01-21 MED ORDER — LIDOCAINE HCL (PF) 1 % IJ SOLN
30.0000 mL | INTRAMUSCULAR | Status: DC | PRN
  Filled 2016-01-21: qty 30

## 2016-01-21 MED ORDER — DIPHENHYDRAMINE HCL 50 MG/ML IJ SOLN: 12.5000 mg | INTRAMUSCULAR | Status: DC | PRN

## 2016-01-21 MED ORDER — LACTATED RINGERS IV SOLN
INTRAVENOUS | Status: DC
  Administered 2016-01-21 (×2): via INTRAVENOUS

## 2016-01-21 MED ORDER — OXYCODONE-ACETAMINOPHEN 5-325 MG PO TABS: 1.0000 | ORAL_TABLET | ORAL | Status: DC | PRN

## 2016-01-21 MED ORDER — TETANUS-DIPHTH-ACELL PERTUSSIS 5-2.5-18.5 LF-MCG/0.5 IM SUSP: 0.5000 mL | Freq: Once | INTRAMUSCULAR | Status: DC

## 2016-01-21 MED ORDER — BENZOCAINE-MENTHOL 20-0.5 % EX AERO
1.0000 "application " | INHALATION_SPRAY | CUTANEOUS | Status: DC | PRN
  Administered 2016-01-22: 1 via TOPICAL
  Filled 2016-01-21 (×2): qty 56

## 2016-01-21 MED ORDER — LACTATED RINGERS IV SOLN
500.0000 mL | Freq: Once | INTRAVENOUS | Status: AC
  Administered 2016-01-21: 500 mL via INTRAVENOUS

## 2016-01-21 MED ORDER — PENICILLIN G POTASSIUM 5000000 UNITS IJ SOLR: 2.5000 10*6.[IU] | INTRAMUSCULAR | Status: DC

## 2016-01-21 MED ORDER — SOD CITRATE-CITRIC ACID 500-334 MG/5ML PO SOLN: 30.0000 mL | ORAL | Status: DC | PRN

## 2016-01-21 MED ORDER — MEDROXYPROGESTERONE ACETATE 150 MG/ML IM SUSP: 150.0000 mg | INTRAMUSCULAR | Status: DC | PRN

## 2016-01-21 MED ORDER — LACTATED RINGERS IV SOLN
500.0000 mL | INTRAVENOUS | Status: DC | PRN
Start: 2016-01-21 — End: 2016-01-21

## 2016-01-21 MED ORDER — OXYTOCIN 40 UNITS IN LACTATED RINGERS INFUSION - SIMPLE MED
2.5000 [IU]/h | INTRAVENOUS | Status: DC
  Filled 2016-01-21: qty 1000

## 2016-01-21 MED ORDER — DIBUCAINE 1 % RE OINT
1.0000 "application " | TOPICAL_OINTMENT | RECTAL | Status: DC | PRN
  Administered 2016-01-22: 1 via RECTAL
  Filled 2016-01-21: qty 28

## 2016-01-21 MED ORDER — PHENYLEPHRINE 40 MCG/ML (10ML) SYRINGE FOR IV PUSH (FOR BLOOD PRESSURE SUPPORT)
80.0000 ug | PREFILLED_SYRINGE | INTRAVENOUS | Status: DC | PRN
  Filled 2016-01-21: qty 5
  Filled 2016-01-21: qty 10

## 2016-01-21 MED ORDER — PHENYLEPHRINE 40 MCG/ML (10ML) SYRINGE FOR IV PUSH (FOR BLOOD PRESSURE SUPPORT)
80.0000 ug | PREFILLED_SYRINGE | INTRAVENOUS | Status: DC | PRN
Start: 2016-01-21 — End: 2016-01-21
  Filled 2016-01-21: qty 5

## 2016-01-21 MED ORDER — EPHEDRINE 5 MG/ML INJ
10.0000 mg | INTRAVENOUS | Status: DC | PRN
  Filled 2016-01-21: qty 4

## 2016-01-21 MED ORDER — BUTORPHANOL TARTRATE 1 MG/ML IJ SOLN: 1.0000 mg | INTRAMUSCULAR | Status: DC | PRN

## 2016-01-21 MED ORDER — ONDANSETRON HCL 4 MG/2ML IJ SOLN: 4.0000 mg | Freq: Four times a day (QID) | INTRAMUSCULAR | Status: DC | PRN

## 2016-01-21 MED ORDER — ONDANSETRON HCL 4 MG/2ML IJ SOLN: 4.0000 mg | INTRAMUSCULAR | Status: DC | PRN

## 2016-01-21 MED ORDER — OXYTOCIN 40 UNITS IN LACTATED RINGERS INFUSION - SIMPLE MED
1.0000 m[IU]/min | INTRAVENOUS | Status: DC
  Administered 2016-01-21: 2 m[IU]/min via INTRAVENOUS

## 2016-01-21 MED ORDER — PENICILLIN G POTASSIUM 5000000 UNITS IJ SOLR
2.5000 10*6.[IU] | INTRAMUSCULAR | Status: DC
  Administered 2016-01-21 (×2): 2.5 10*6.[IU] via INTRAVENOUS
  Filled 2016-01-21 (×4): qty 2.5

## 2016-01-21 MED ORDER — OXYTOCIN BOLUS FROM INFUSION: 500.0000 mL | Freq: Once | INTRAVENOUS | Status: DC

## 2016-01-21 MED ORDER — PENICILLIN G POTASSIUM 5000000 UNITS IJ SOLR
5.0000 10*6.[IU] | Freq: Once | INTRAVENOUS | Status: AC
  Administered 2016-01-21: 5 10*6.[IU] via INTRAVENOUS
  Filled 2016-01-21: qty 5

## 2016-01-21 MED ORDER — ACETAMINOPHEN 325 MG PO TABS: 650.0000 mg | ORAL_TABLET | ORAL | Status: DC | PRN

## 2016-01-21 MED ORDER — LEVOTHYROXINE SODIUM 50 MCG PO TABS
50.0000 ug | ORAL_TABLET | Freq: Every day | ORAL | Status: DC
  Administered 2016-01-23: 50 ug via ORAL
  Filled 2016-01-21 (×3): qty 1

## 2016-01-21 MED ORDER — COCONUT OIL OIL
1.0000 "application " | TOPICAL_OIL | Status: DC | PRN
  Filled 2016-01-21: qty 120

## 2016-01-21 MED ORDER — ONDANSETRON HCL 4 MG PO TABS: 4.0000 mg | ORAL_TABLET | ORAL | Status: DC | PRN

## 2016-01-21 MED ORDER — ACETAMINOPHEN 325 MG PO TABS
650.0000 mg | ORAL_TABLET | ORAL | Status: DC | PRN
  Administered 2016-01-21: 650 mg via ORAL
  Filled 2016-01-21: qty 2

## 2016-01-21 MED ORDER — MEASLES, MUMPS & RUBELLA VAC ~~LOC~~ INJ
0.5000 mL | INJECTION | Freq: Once | SUBCUTANEOUS | Status: DC
  Filled 2016-01-21: qty 0.5

## 2016-01-21 MED ORDER — ZOLPIDEM TARTRATE 5 MG PO TABS: 5.0000 mg | ORAL_TABLET | Freq: Every evening | ORAL | Status: DC | PRN

## 2016-01-21 MED ORDER — LIDOCAINE HCL (PF) 1 % IJ SOLN
INTRAMUSCULAR | Status: DC | PRN
  Administered 2016-01-21 (×2): 4 mL

## 2016-01-21 NOTE — H&P (Signed)
Molly RileKristy Jacobs is a 35 y.o. female presenting for IOL due to postdates and A1Dm with good control.  US last week had EFW at 7+12lb.  GBS+  Pregnancy otherwise uncomplicated. OB History    Gravida Para Term Preterm AB Living   2 0 0 0 0 0   SAB TAB Ectopic Multiple Live Births   0 0 0 0       Past Medical History:  Diagnosis Date  . Hypothyroidism   . PCOS (polycystic ovarian syndrome)    Past Surgical History:  Procedure Laterality Date  . LEG SURGERY    . TONSILLECTOMY    . WISDOM TOOTH EXTRACTION     Family History: family history includes COPD in her maternal grandfather; Cancer in her paternal grandfather and paternal grandmother; Diabetes in her father and paternal grandfather; Heart attack in her paternal grandfather; Hypertension in her father and paternal grandfather; Migraines in her mother; Stroke in her maternal grandmother. Social History:  reports that she has never smoked. She has never used smokeless tobacco. She reports that she does not drink alcohol or use drugs.     Maternal Diabetes: Yes:  Diabetes Type:  Diet controlled Genetic Screening: Normal Maternal Ultrasounds/Referrals: Normal Fetal Ultrasounds or other Referrals:  None Maternal Substance Abuse:  No Significant Maternal Medications:  None Significant Maternal Lab Results:  None Other Comments:  None  ROS History Dilation: 3 Effacement (%): 80 Station: -3 Exam by:: Dr. Rana SnareLowe Blood pressure (!) 142/73, pulse 68, temperature 98 F (36.7 C), temperature source Oral, resp. rate 16, height 5' (1.524 m), weight 213 lb (96.6 kg), last menstrual period 04/14/2015, SpO2 97 %, unknown if currently breastfeeding. Exam Physical Exam  Prenatal labs: ABO, Rh: A/Positive/-- (01/25 0000) Antibody: Negative (01/25 0000) Rubella: Immune (01/25 0000) RPR: Nonreactive (01/25 0000)  HBsAg: Negative (01/25 0000)  HIV: Non-reactive (01/25 0000)  GBS: Positive (08/01 0000)   Assessment/Plan: IUP at  term AROM/Pitocin IV Abx for GBS Will monitor Glc, A1DM in good control Anticipate SVD   Harless Molinari C 01/21/2016, 9:00 AM

## 2016-01-21 NOTE — Anesthesia Preprocedure Evaluation (Signed)
Anesthesia Evaluation  Patient identified by MRN, date of birth, ID band Patient awake    Reviewed: Allergy & Precautions, NPO status , Patient's Chart, lab work & pertinent test results  History of Anesthesia Complications Negative for: history of anesthetic complications  Airway Mallampati: III  TM Distance: >3 FB Neck ROM: Full    Dental no notable dental hx. (+) Dental Advisory Given   Pulmonary neg pulmonary ROS,    Pulmonary exam normal breath sounds clear to auscultation       Cardiovascular negative cardio ROS Normal cardiovascular exam Rhythm:Regular Rate:Normal     Neuro/Psych negative neurological ROS  negative psych ROS   GI/Hepatic negative GI ROS, Neg liver ROS,   Endo/Other  Hypothyroidism Morbid obesity  Renal/GU negative Renal ROS  negative genitourinary   Musculoskeletal negative musculoskeletal ROS (+)   Abdominal   Peds negative pediatric ROS (+)  Hematology negative hematology ROS (+)   Anesthesia Other Findings   Reproductive/Obstetrics (+) Pregnancy                             Anesthesia Physical Anesthesia Plan  ASA: III  Anesthesia Plan: Epidural   Post-op Pain Management:    Induction:   Airway Management Planned:   Additional Equipment:   Intra-op Plan:   Post-operative Plan:   Informed Consent: I have reviewed the patients History and Physical, chart, labs and discussed the procedure including the risks, benefits and alternatives for the proposed anesthesia with the patient or authorized representative who has indicated his/her understanding and acceptance.   Dental advisory given  Plan Discussed with: CRNA  Anesthesia Plan Comments:         Anesthesia Quick Evaluation

## 2016-01-21 NOTE — Anesthesia Procedure Notes (Signed)
Epidural Patient location during procedure: OB  Staffing Anesthesiologist: Etienne Millward Performed: anesthesiologist   Preanesthetic Checklist Completed: patient identified, site marked, surgical consent, pre-op evaluation, timeout performed, IV checked, risks and benefits discussed and monitors and equipment checked  Epidural Patient position: sitting Prep: site prepped and draped and DuraPrep Patient monitoring: continuous pulse ox and blood pressure Approach: midline Location: L3-L4 Injection technique: LOR saline  Needle:  Needle type: Tuohy  Needle gauge: 17 G Needle length: 9 cm and 9 Needle insertion depth: 5 cm cm Catheter type: closed end flexible Catheter size: 19 Gauge Catheter at skin depth: 10 cm Test dose: negative  Assessment Events: blood not aspirated, injection not painful, no injection resistance, negative IV test and no paresthesia  Additional Notes Patient identified. Risks/Benefits/Options discussed with patient including but not limited to bleeding, infection, nerve damage, paralysis, failed block, incomplete pain control, headache, blood pressure changes, nausea, vomiting, reactions to medication both or allergic, itching and postpartum back pain. Confirmed with bedside nurse the patient's most recent platelet count. Confirmed with patient that they are not currently taking any anticoagulation, have any bleeding history or any family history of bleeding disorders. Patient expressed understanding and wished to proceed. All questions were answered. Sterile technique was used throughout the entire procedure. Please see nursing notes for vital signs. Test dose was given through epidural catheter and negative prior to continuing to dose epidural or start infusion. Warning signs of high block given to the patient including shortness of breath, tingling/numbness in hands, complete motor block, or any concerning symptoms with instructions to call for help. Patient was  given instructions on fall risk and not to get out of bed. All questions and concerns addressed with instructions to call with any issues or inadequate analgesia.        

## 2016-01-21 NOTE — Anesthesia Pain Management Evaluation Note (Signed)
  CRNA Pain Management Visit Note  Patient: Molly RileKristy Jacobs, 35 y.o., female  "Hello I am a member of the anesthesia team at [facility]. We have an anesthesia team available at all times to provide care throughout the hospital, including epidural management and anesthesia for C-section. I don't know your plan for the delivery whether it a natural birth, water birth, IV sedation, nitrous supplementation, doula or epidural, but we want to meet your pain goals."   1.Was your pain managed to your expectations on prior hospitalizations?   Yes   2.What is your expectation for pain management during this hospitalization?     Epidural  3.How can we help you reach that goal? epidural  Record the patient's initial score and the patient's pain goal.   Pain: 2  Pain Goal: 6 [facility] wants you to be able to say your pain was always managed very well.  Edison PaceWILKERSON,Casee Knepp 01/21/2016

## 2016-01-22 LAB — COMPREHENSIVE METABOLIC PANEL
ALT: 15 U/L (ref 14–54)
AST: 27 U/L (ref 15–41)
Albumin: 2.7 g/dL — ABNORMAL LOW (ref 3.5–5.0)
Alkaline Phosphatase: 106 U/L (ref 38–126)
Anion gap: 5 (ref 5–15)
BILIRUBIN TOTAL: 0.4 mg/dL (ref 0.3–1.2)
BUN: 8 mg/dL (ref 6–20)
CO2: 24 mmol/L (ref 22–32)
CREATININE: 0.67 mg/dL (ref 0.44–1.00)
Calcium: 8.7 mg/dL — ABNORMAL LOW (ref 8.9–10.3)
Chloride: 108 mmol/L (ref 101–111)
Glucose, Bld: 78 mg/dL (ref 65–99)
POTASSIUM: 3.6 mmol/L (ref 3.5–5.1)
Sodium: 137 mmol/L (ref 135–145)
TOTAL PROTEIN: 5.9 g/dL — AB (ref 6.5–8.1)

## 2016-01-22 LAB — GLUCOSE, CAPILLARY: GLUCOSE-CAPILLARY: 85 mg/dL (ref 65–99)

## 2016-01-22 NOTE — Lactation Note (Addendum)
This note was copied from a baby's chart. Lactation Consultation Note  Patient Name: Molly Jacobs ZOXWR'UToday's Date: 01/22/2016 Reason for consult: Follow-up assessment Baby at 26 hr of life. RN requesting lactation because mom has sore nipples. No skin break down noted, but there is areolar bruising on the R breast. RN applied #24 NS and mom reported latch felt better. Upon entry baby was in sideline position with arms swaddled down by her sides, her head turned to mom, hips facing the ceiling, and nipple sliding in/out of baby's mouth with each suck. Un swaddled baby, rotated baby toward mom, and positioned baby's chin touching mom's breast. Baby was able to achieve a deeper more comfortable latch. Reviewed nipple care. Mom will call as needed. RN will give Harmony to be used after bf with the NS.    Maternal Data    Feeding Feeding Type: Breast Fed  LATCH Score/Interventions Latch: Repeated attempts needed to sustain latch, nipple held in mouth throughout feeding, stimulation needed to elicit sucking reflex. Intervention(s): Teach feeding cues;Waking techniques Intervention(s): Adjust position;Assist with latch  Audible Swallowing: A few with stimulation  Type of Nipple: Everted at rest and after stimulation  Comfort (Breast/Nipple): Filling, red/small blisters or bruises, mild/mod discomfort  Problem noted: Mild/Moderate discomfort;Cracked, bleeding, blisters, bruises Interventions  (Cracked/bleeding/bruising/blister): Expressed breast milk to nipple;Other (comment) (coconut oil) Interventions (Mild/moderate discomfort): Breast shields  Hold (Positioning): Full assist, staff holds infant at breast Intervention(s): Position options;Breastfeeding basics reviewed;Support Pillows  LATCH Score: 5  Lactation Tools Discussed/Used     Consult Status Consult Status: Follow-up Date: 01/23/16 Follow-up type: In-patient    Rulon Eisenmengerlizabeth E Shravya Wickwire 01/22/2016, 10:34 PM

## 2016-01-22 NOTE — Progress Notes (Signed)
Post Partum Day 1 Subjective: no complaints, up ad lib, voiding, tolerating PO and denies HA, blurred vision  Objective: Blood pressure (!) 144/81, pulse 84, temperature 99.3 F (37.4 C), temperature source Oral, resp. rate 18, height 5' (1.524 m), weight 213 lb (96.6 kg), last menstrual period 04/14/2015, SpO2 100 %, unknown if currently breastfeeding.  Physical Exam:  General: alert and cooperative Lochia: appropriate Uterine Fundus: non- tender Incision: healing well DVT Evaluation: No evidence of DVT seen on physical exam. Negative Homan's sign. No cords or calf tenderness. No significant calf/ankle edema.   Recent Labs  01/21/16 0130  HGB 13.0  HCT 38.6    Assessment/Plan: CMP ordered, observe  temp, check 2 hr pp blood sugar   LOS: 1 day   Molly Jacobs G 01/22/2016, 8:02 AM

## 2016-01-22 NOTE — Anesthesia Postprocedure Evaluation (Signed)
Anesthesia Post Note  Patient: Molly Jacobs  Procedure(s) Performed: * No procedures listed *  Patient location during evaluation: Mother Baby Anesthesia Type: Epidural Level of consciousness: awake Pain management: satisfactory to patient Vital Signs Assessment: post-procedure vital signs reviewed and stable Respiratory status: spontaneous breathing Cardiovascular status: stable Anesthetic complications: no     Last Vitals:  Vitals:   01/22/16 0015 01/22/16 0420  BP: (!) 146/87 (!) 144/81  Pulse: 87 84  Resp: 18 18  Temp: 37.3 C 37.4 C    Last Pain:  Vitals:   01/22/16 0420  TempSrc: Oral  PainSc: 0-No pain   Pain Goal:                 KeyCorpBURGER,Staley Budzinski

## 2016-01-22 NOTE — Progress Notes (Signed)
Patient ambulated out of bed with minimal to no assistance, voided 400 of amber colored urine. Peri care given and instructed on the use of the peribottle. Patient ambulated to her bed with no assistance.

## 2016-01-22 NOTE — Progress Notes (Signed)
Notified Dr. Vincente PoliGrewal of CMP results, CBG of 85 that was taken 2 hours after breakfast, and B/P of 134/67. No new orders.

## 2016-01-22 NOTE — Lactation Note (Signed)
This note was copied from a baby's chart. Lactation Consultation Note  Patient Name: Molly Geralynn RileKristy Jacobs ZOXWR'UToday's Date: 01/22/2016 Reason for consult: Initial assessment Assisted Mom with breast compression for baby to obtain good depth with latch. Mom has bilateral aerola bruising from previous latches. Baby is a little aggressive with latch.  Some intermittent chewing observed but then baby developed a more nutritive suckling pattern. Colostrum present with hand expression. Advised to apply EBM for nipple tenderness. Basic teaching reviewed with Mom, encouraged to continue to BF with feeding ques. Mom has history of PCOS - took 4 years for pregnancy. Mom does report good breast changes with early pregnancy. History of hypothyroid - advised to have TSH checked PP. Lactation brochure left for review, advised of OP services and support group. Encouraged to call for assist as needed with latch.   Maternal Data Has patient been taught Hand Expression?: Yes Does the patient have breastfeeding experience prior to this delivery?: No  Feeding Feeding Type: Breast Fed Length of feed: 15 min  LATCH Score/Interventions Latch: Grasps breast easily, tongue down, lips flanged, rhythmical sucking. Intervention(s): Skin to skin Intervention(s): Adjust position;Assist with latch;Breast massage;Breast compression  Audible Swallowing: A few with stimulation Intervention(s): Skin to skin;Hand expression  Type of Nipple: Everted at rest and after stimulation  Comfort (Breast/Nipple): Filling, red/small blisters or bruises, mild/mod discomfort (aerola bruising bilateral)  Problem noted: Mild/Moderate discomfort  Hold (Positioning): Assistance needed to correctly position infant at breast and maintain latch. Intervention(s): Breastfeeding basics reviewed;Support Pillows;Position options;Skin to skin  LATCH Score: 7  Lactation Tools Discussed/Used WIC Program: No   Consult Status Consult Status:  Follow-up Date: 01/23/16 Follow-up type: In-patient    Molly LevinsGranger, Molly Jacobs 01/22/2016, 12:11 PM

## 2016-01-22 NOTE — Progress Notes (Signed)
Patient transferred to my care from labor and delivery into room 110. Assessment as noted in the flowsheet. Oriented to room, call bell within reach. Discussed baby and me booklet, plan of care and patient safety. Instructed not to get out of bed without assistance. She verbalizes her understanding.

## 2016-01-23 LAB — CBC
HCT: 29.7 % — ABNORMAL LOW (ref 36.0–46.0)
HEMOGLOBIN: 9.9 g/dL — AB (ref 12.0–15.0)
MCH: 27.7 pg (ref 26.0–34.0)
MCHC: 33.3 g/dL (ref 30.0–36.0)
MCV: 83.2 fL (ref 78.0–100.0)
Platelets: 205 10*3/uL (ref 150–400)
RBC: 3.57 MIL/uL — ABNORMAL LOW (ref 3.87–5.11)
RDW: 16 % — AB (ref 11.5–15.5)
WBC: 14.2 10*3/uL — AB (ref 4.0–10.5)

## 2016-01-23 MED ORDER — IBUPROFEN 600 MG PO TABS: 600.0000 mg | ORAL_TABLET | Freq: Four times a day (QID) | ORAL | 1 refills | Status: AC

## 2016-01-23 NOTE — Discharge Summary (Signed)
Obstetric Discharge Summary Reason for Admission: induction of labor Prenatal Procedures: ultrasound Intrapartum Procedures: spontaneous vaginal delivery Postpartum Procedures: none Complications-Operative and Postpartum: 2 degree perineal laceration Hemoglobin  Date Value Ref Range Status  01/23/2016 9.9 (L) 12.0 - 15.0 g/dL Final   HCT  Date Value Ref Range Status  01/23/2016 29.7 (L) 36.0 - 46.0 % Final    Physical Exam:  General: alert and cooperative Lochia: appropriate Uterine Fundus: firm Incision: healing well, small non- thrombosed hemorrhoids noted. DVT Evaluation: No evidence of DVT seen on physical exam. Negative Homan's sign. No cords or calf tenderness. No significant calf/ankle edema.  Discharge Diagnoses: Term Pregnancy-delivered  Discharge Information: Date: 01/23/2016 Activity: pelvic rest Diet: routine Medications: PNV, Ibuprofen and synthroid Condition: stable Instructions: refer to practice specific booklet Discharge to: home   Newborn Data: Live born female  Birth Weight: 7 lb 14.8 oz (3595 g) APGAR: 9, 10  Home with mother.  CURTIS,CAROL G 01/23/2016, 8:16 AM

## 2016-01-23 NOTE — Progress Notes (Signed)
Post Partum Day 2 Subjective: up ad lib, voiding, tolerating PO, + flatus and pulling sensation at episiotomy site  Objective: Blood pressure 127/75, pulse 66, temperature 97.9 F (36.6 C), temperature source Oral, resp. rate 18, height 5' (1.524 m), weight 213 lb (96.6 kg), last menstrual period 04/14/2015, SpO2 100 %, unknown if currently breastfeeding.  Physical Exam:  General: alert and cooperative Lochia: appropriate Uterine Fundus: firm Incision: healing well, small non- thrombosed hemorrhoids noted DVT Evaluation: No evidence of DVT seen on physical exam. Negative Homan's sign. No cords or calf tenderness. No significant calf/ankle edema.   Recent Labs  01/21/16 0130 01/23/16 0537  HGB 13.0 9.9*  HCT 38.6 29.7*    Assessment/Plan: Discharge home and Breastfeeding   LOS: 2 days   CURTIS,CAROL G 01/23/2016, 8:13 AM

## 2016-01-23 NOTE — Lactation Note (Signed)
This note was copied from a baby's chart. Lactation Consultation Note  Patient Name: Molly Jacobs ZOXWR'UToday's Date: 01/23/2016 Reason for consult: Follow-up assessment  Baby is 37 hours old, 5% weight loss, Bili check low.  Latch scores up and down , nipples small positional strips , bruising noted  Both areolas. [facility] started during the night  Do to painful latches and that the baby was already latched, but the baby wasn't  Maintaining depth with the NS and baby's mouth was going in and out at the base  Of the NS. LC observed the same feeding pattern, noted a few swallows, but no  Milk in the NS when the baby released. [facility] RN had reported there had been colostrum in the  NS. When baby ended feeding, baby still showing feeding cues and LC offered to assist with latch  Without the NS. Areola compresses well and steady flow of colostrum.  Baby latched with depth by sandwiching the breast tissue and waiting for swallows and a strong tug.  Baby fed for 15 mins on the right breast , after latch , switched to the left breast , baby latched well for 5 mins  And released on her own and fell asleep. Baby seemed content. Per mom when latching initially some discomfort  An d eased up and comfort achieved for both breast. LC noted some areola edema and recommended to mom the use of breast  Shells between feedings for reverse pressure. When latching on the left breast , LC showed mom reverse pressure exercise prior  Top latch. Shells will prevent enhance prevention of soreness. Sore nipple and engorgement prevention and tx reviewed .  Mom has a hand pump and a DEBP ( Medela ) at home. LC will provide 2 - #27 Flanges for when milk comes in.  Mother informed of post-discharge support and given phone number to the lactation department, including services for phone  call assistance; out-patient appointments; and breastfeeding support group. List of other breastfeeding resources in  the community  given in the handout. Encouraged mother to call for problems or concerns related to breastfeeding.   Maternal Data Has patient been taught Hand Expression?: Yes  Feeding Feeding Type: Breast Fed Length of feed: 5 min (switched to left breast / side lying , swallows noted )  LATCH Score/Interventions Latch: Grasps breast easily, tongue down, lips flanged, rhythmical sucking. Intervention(s): Skin to skin;Teach feeding cues;Waking techniques Intervention(s): Adjust position;Assist with latch;Breast massage;Breast compression  Audible Swallowing: Spontaneous and intermittent Intervention(s): Skin to skin;Hand expression  Type of Nipple: Everted at rest and after stimulation (areola noted to edema , reverse pressure helped )  Comfort (Breast/Nipple): Filling, red/small blisters or bruises, mild/mod discomfort  Problem noted: Mild/Moderate discomfort Interventions  (Cracked/bleeding/bruising/blister): Expressed breast milk to nipple;Reverse pressure Interventions (Mild/moderate discomfort): Reverse pressue;Hand expression;Breast shields  Hold (Positioning): Assistance needed to correctly position infant at breast and maintain latch. Intervention(s): Breastfeeding basics reviewed;Support Pillows;Position options;Skin to skin  LATCH Score: 8  Lactation Tools Discussed/Used Tools: Shells (LC instructed mom ) Nipple shield size: 24;Other (comment) (baby doesn't maintain depth ) Shell Type: Inverted Breast pump type: Manual WIC Program: No   Consult Status Consult Status: Complete Date: 01/23/16 Follow-up type: In-patient    Kathrin Greathouseorio, Carnie Bruemmer Ann 01/23/2016, 9:46 AM

## 2016-02-01 ENCOUNTER — Telehealth (HOSPITAL_COMMUNITY): Payer: Self-pay | Admitting: Lactation Services

## 2016-02-01 NOTE — Telephone Encounter (Signed)
Mom called with questions regarding engorgement. Yesterday her breast where filling and Mom had some slight engorgement but she reports today this has resolved. LC reviewed engorgement care. Advised to monitor for any nodules that are not softening with baby nursing or pumping. If she has any nodules, be sure to massage with nursing/pumping to soften. S/S mastitis reviewed. Answered Mom's general BF questions.

## 2016-02-19 ENCOUNTER — Telehealth (HOSPITAL_COMMUNITY): Payer: Self-pay | Admitting: Lactation Services

## 2016-02-19 NOTE — Telephone Encounter (Signed)
Mom called reporting she noticed small pea sized nodule under left axilla. 1st noticed last Thursday or Friday has not resolved. Mom denies tenderness or redness today, reports nodule had mild tenderness to palpation yesterday. No fever. Mom is exclusively BF. Baby born on Sept 5th. Mom has not changed any personal hygiene products. Advised Mom to apply warm compress over nodule prior to nursing, massage nodule with baby nursing to see if this will resolve the nodule if plugged duct. Advised also to call OB for evaluation. Advised Mom could possible be sebaceous cyst as well.

## 2016-10-13 IMAGING — US US MFM OB DETAIL+14 WK
1 series · 14 of 28 positions shown · non-contrast
Comparison: none

[Series 1: us mfm ob detail+14 wk · 63 acquisitions, 14 frames shown]
[im 3/63]
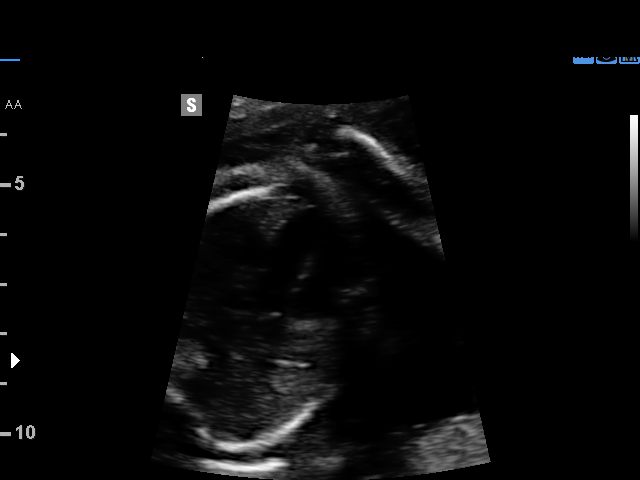
[im 7/63]
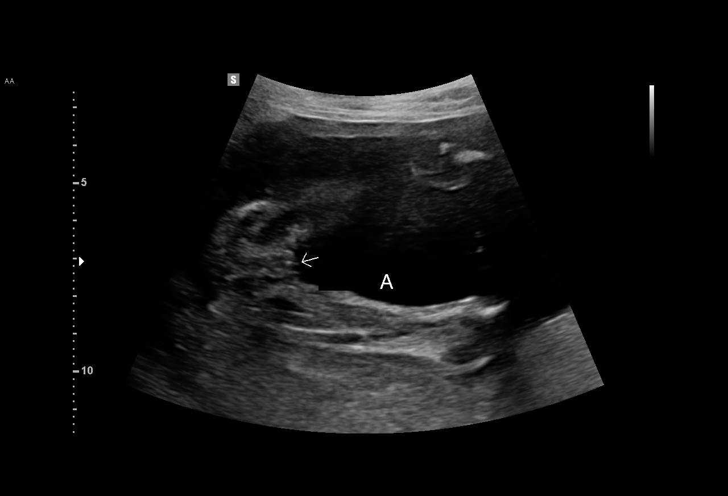
[im 12/63]
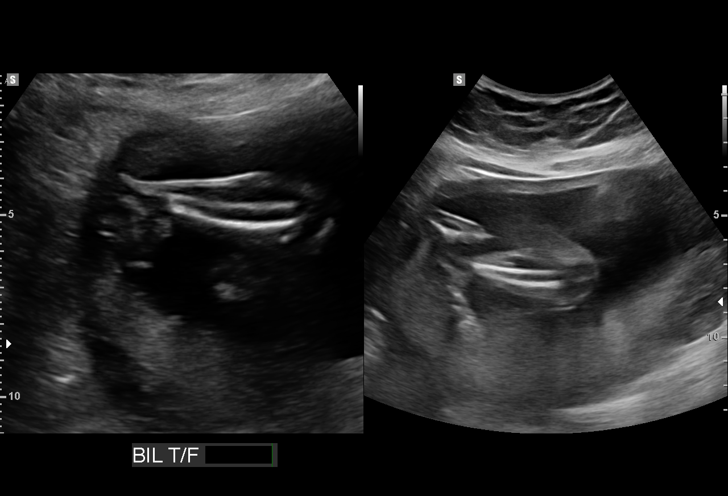
[im 17/63]
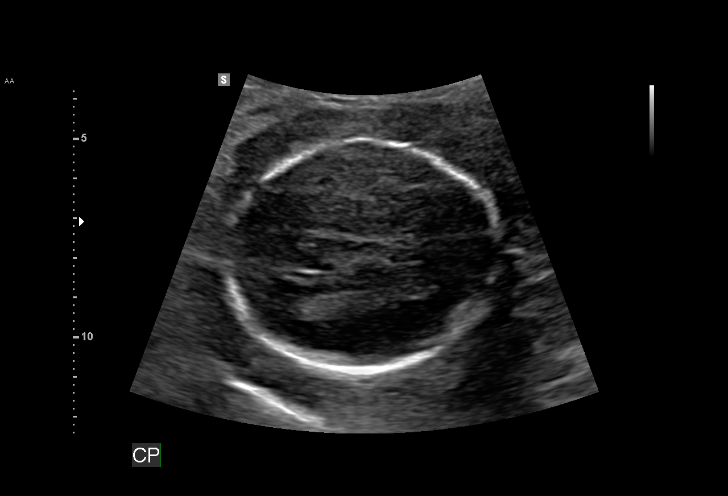
[im 21/63]
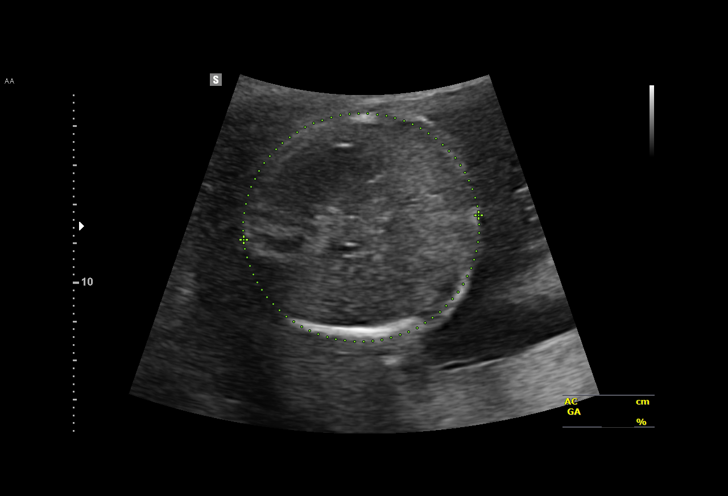
[im 26/63]
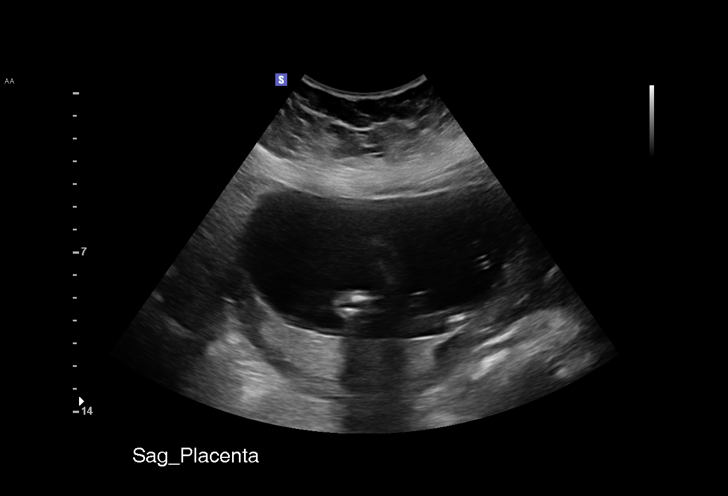
[im 30/63]
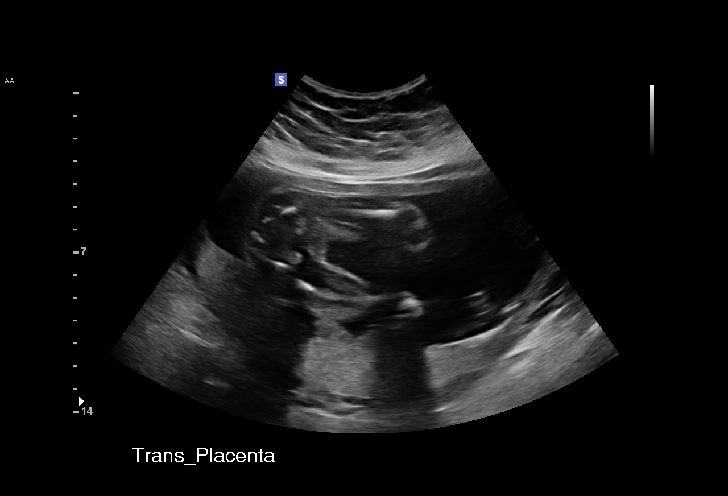
[im 35/63]
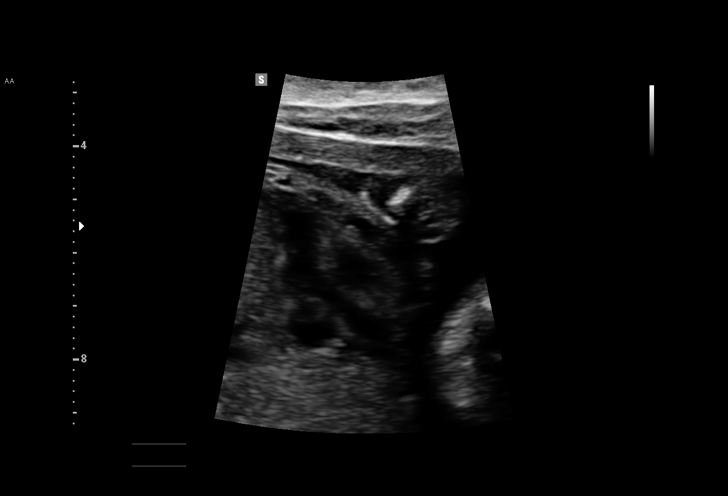
[im 40/63]
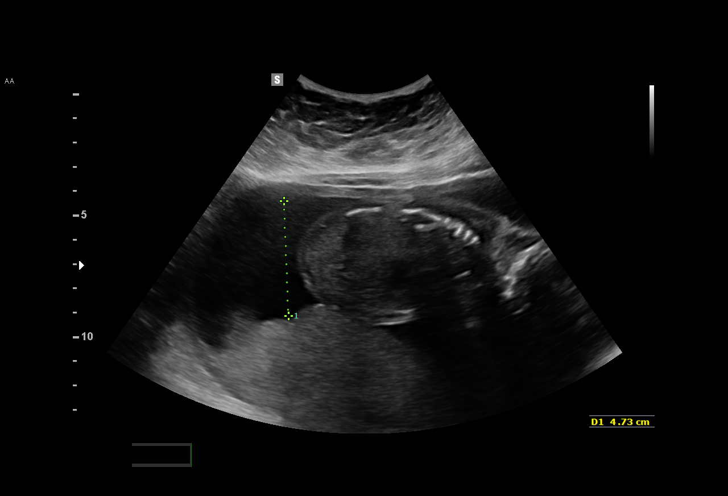
[im 44/63]
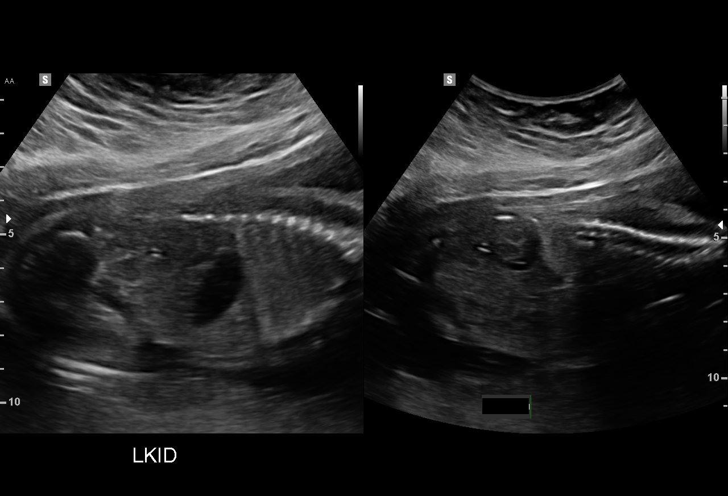
[im 49/63]
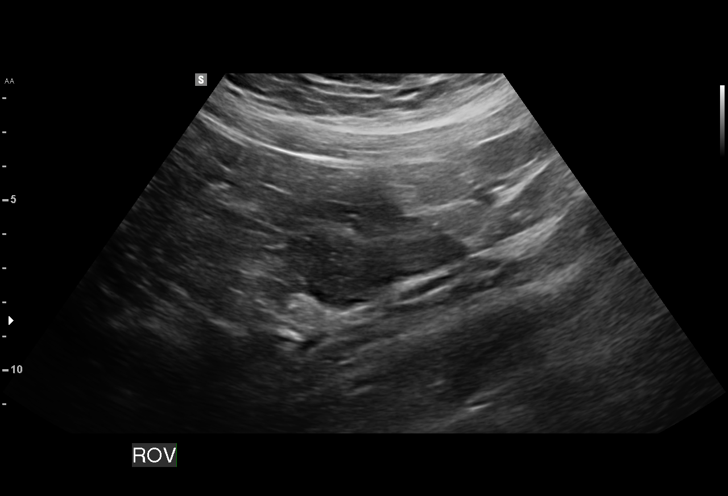
[im 53/63]
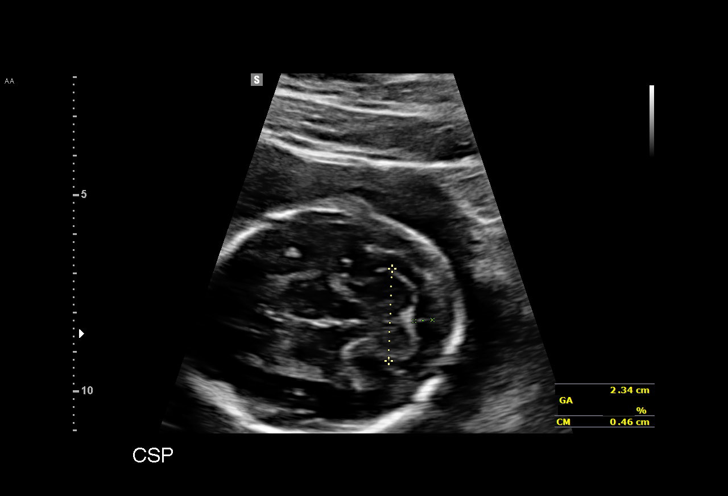
[im 58/63]
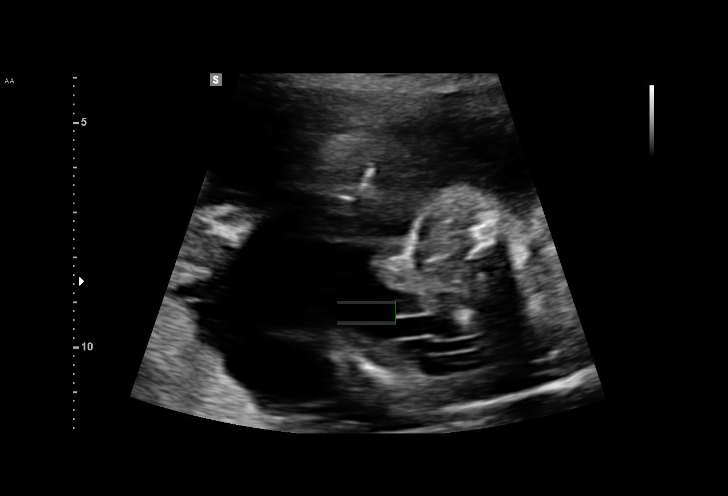
[im 63/63]
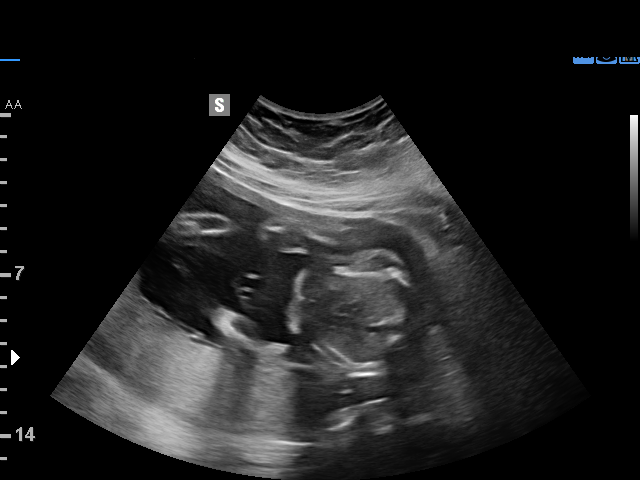

[14 of 28 positions shown; findings below may reference images not displayed]

[address]               [id number]      [id number]     [id number]
Indications

23 weeks gestation of pregnancy
Basic anatomic survey                          Z36
Hypothyroidism
OB History

Blood Type:            Height:         Weight (lb):  187       BMI:
Gravidity:    1
Fetal Evaluation

Num Of Fetuses:     1
Fetal Heart         155
Rate(bpm):
Cardiac Activity:   Observed
Presentation:       Cephalic
Placenta:           Posterior, above cervical os

Amniotic Fluid
AFI FV:      Subjectively within normal limits

Largest Pocket(cm)
4.7
Biometry

BPD:      56.6  mm     G. Age:  23w 2d         51  %    CI:        76.58   %    70 - 86
FL/HC:       19.8  %    19.2 -
HC:      204.9  mm     G. Age:  22w 4d         18  %    HC/AC:       1.10       1.05 -
AC:      186.3  mm     G. Age:  23w 3d         50  %    FL/BPD:      71.6  %    71 - 87
FL:       40.5  mm     G. Age:  23w 1d         38  %    FL/AC:       21.7  %    20 - 24

Est. FW:     573   gm     1 lb 4 oz     54  %
Gestational Age

Clinical EDD:  23w 1d                                        EDD:   01/19/16
U/S Today:     23w 1d                                        EDD:   01/19/16
Best:          23w 1d     Det. By:  Clinical EDD             EDD:   01/19/16
Anatomy

Cranium:               Appears normal         LVOT:                   Appears normal
Cavum:                 Not well visualized    Aortic Arch:            Not well visualized
Ventricles:            Appears normal         Ductal Arch:            Not well visualized
Choroid Plexus:        Appears normal         Diaphragm:              Appears normal
Cerebellum:            Appears normal         Stomach:                Appears normal, left
sided
Posterior Fossa:       Appears normal         Abdomen:                Appears normal
Nuchal Fold:           Not applicable (>20    Abdominal Wall:         Appears nml (cord
wks GA)                                        insert, abd wall)
Face:                  Appears normal         Cord Vessels:           Appears normal (3
(orbits and profile)                           vessel cord)
Lips:                  Appears normal         Kidneys:                Appear normal
Palate:                Appears normal         Bladder:                Appears normal
Thoracic:              Appears normal         Spine:                  Appears normal
Heart:                 Appears normal         Upper Extremities:      Appears normal
(4CH, axis, and
situs)
RVOT:                  Not well visualized    Lower Extremities:      Appears normal

Other:  Fetus appears to be a female. Heels and 5th digit visualized.
Technically difficult due to maternal habitus and fetal position.
Cervix Uterus Adnexa

Cervix
Length:            3.7  cm.
Normal appearance by transabdominal scan.

Left Ovary
Hyperechoic area noted within ovary 2.8 x 2.1 x 2.5cm
Impression

Single IUP at 23w 1d
Limited views of the fetal heart and cavuum obtained
The remainder of the fetal anatomy appears normal
Posterior placenta without previa
Normal amniotic fluid volume
Recommendations

Recommend follow up ultrasound in 4 weeks to complete
anatomy

## 2019-05-29 ENCOUNTER — Emergency Department
Admission: EM | Admit: 2019-05-29 | Discharge: 2019-05-29 | Disposition: A | Discharge status: Home / Self Care | Payer: BC Managed Care – PPO | Source: Home / Self Care

## 2019-05-29 ENCOUNTER — Other Ambulatory Visit: Payer: Self-pay

## 2019-05-29 DIAGNOSIS — W5501XA Bitten by cat, initial encounter: Secondary | ICD-10-CM | POA: Diagnosis not present

## 2019-05-29 DIAGNOSIS — S61259A Open bite of unspecified finger without damage to nail, initial encounter: Secondary | ICD-10-CM

## 2019-05-29 LAB — POCT URINE PREGNANCY: Preg Test, Ur: NEGATIVE

## 2019-05-29 MED ORDER — IBUPROFEN 200 MG PO TABS
200.0000 mg | ORAL_TABLET | Freq: Once | ORAL | Status: AC
  Administered 2019-05-29: 11:00:00 200 mg via ORAL

## 2019-05-29 MED ORDER — IBUPROFEN 200 MG PO TABS
200.0000 mg | ORAL_TABLET | Freq: Once | ORAL | Status: AC
  Administered 2019-05-29: 200 mg via ORAL

## 2019-05-29 MED ORDER — DOXYCYCLINE HYCLATE 100 MG PO CAPS: 100.0000 mg | ORAL_CAPSULE | Freq: Two times a day (BID) | ORAL | 0 refills | Status: AC

## 2019-05-29 MED ORDER — METRONIDAZOLE 500 MG PO TABS: 500.0000 mg | ORAL_TABLET | Freq: Two times a day (BID) | ORAL | 0 refills | Status: AC

## 2019-05-29 NOTE — ED Triage Notes (Signed)
Last night when taking the trash out, Cat got spooked, and scratched right hand.  Has an opening on the thumb and pinky.

## 2019-05-29 NOTE — Discharge Instructions (Signed)
Continue to monitor for signs of infection. If hand swelling or warmth occurs, return for follow-up evaluation. I would like for you to return for wound check in 3 days 06/01/19, here at urgent care.

## 2020-01-12 ENCOUNTER — Other Ambulatory Visit: Payer: BC Managed Care – PPO

## 2020-01-12 ENCOUNTER — Other Ambulatory Visit: Payer: Self-pay

## 2020-01-12 DIAGNOSIS — Z20822 Contact with and (suspected) exposure to covid-19: Secondary | ICD-10-CM

## 2020-01-13 LAB — NOVEL CORONAVIRUS, NAA: SARS-CoV-2, NAA: NOT DETECTED

## 2020-01-13 LAB — SARS-COV-2, NAA 2 DAY TAT

## 2022-03-10 ENCOUNTER — Other Ambulatory Visit: Payer: Self-pay | Admitting: Obstetrics and Gynecology

## 2022-03-10 DIAGNOSIS — E039 Hypothyroidism, unspecified: Secondary | ICD-10-CM

## 2022-03-27 ENCOUNTER — Ambulatory Visit
Admission: RE | Admit: 2022-03-27 | Discharge: 2022-03-27 | Disposition: A | Discharge status: Home / Self Care | Payer: BC Managed Care – PPO | Source: Ambulatory Visit | Attending: Obstetrics and Gynecology | Admitting: Obstetrics and Gynecology

## 2022-03-27 DIAGNOSIS — E039 Hypothyroidism, unspecified: Secondary | ICD-10-CM
